# Patient Record
Sex: Female | Born: 1990 | Race: Black or African American | Hispanic: No | Marital: Single | Smoking: Never smoker
Health system: Southern US, Community
[De-identification: ages and names within clinical notes are randomized; demographics above are authoritative.]

---

## 2017-07-13 ENCOUNTER — Encounter: Payer: Self-pay | Admitting: Emergency Medicine

## 2017-07-13 DIAGNOSIS — N760 Acute vaginitis: Secondary | ICD-10-CM | POA: Diagnosis not present

## 2017-07-13 DIAGNOSIS — N939 Abnormal uterine and vaginal bleeding, unspecified: Secondary | ICD-10-CM | POA: Insufficient documentation

## 2017-07-13 DIAGNOSIS — B9689 Other specified bacterial agents as the cause of diseases classified elsewhere: Secondary | ICD-10-CM | POA: Diagnosis not present

## 2017-07-13 LAB — CBC
HCT: 32.2 % — ABNORMAL LOW (ref 35.0–47.0)
HEMOGLOBIN: 10.9 g/dL — AB (ref 12.0–16.0)
MCH: 27.8 pg (ref 26.0–34.0)
MCHC: 33.7 g/dL (ref 32.0–36.0)
MCV: 82.5 fL (ref 80.0–100.0)
Platelets: 336 10*3/uL (ref 150–440)
RBC: 3.91 MIL/uL (ref 3.80–5.20)
RDW: 15.1 % — ABNORMAL HIGH (ref 11.5–14.5)
WBC: 8.6 10*3/uL (ref 3.6–11.0)

## 2017-07-13 LAB — POCT PREGNANCY, URINE: Preg Test, Ur: NEGATIVE

## 2017-07-13 NOTE — ED Triage Notes (Signed)
Pt c/o vaginal bleeding x3 weeks. Per pt she had 2 weeks of light dark bleeding and in the last few days has started to have increase in bright red vaginal bleeding. Pts last menstrual cycle was 06/15/17 and pt sts she takes birth control pill dailey.

## 2017-07-14 ENCOUNTER — Emergency Department: Payer: 59

## 2017-07-14 ENCOUNTER — Emergency Department
Admission: EM | Admit: 2017-07-14 | Discharge: 2017-07-14 | Disposition: A | Discharge status: Home / Self Care | Payer: 59 | Attending: Emergency Medicine | Admitting: Emergency Medicine

## 2017-07-14 DIAGNOSIS — B9689 Other specified bacterial agents as the cause of diseases classified elsewhere: Secondary | ICD-10-CM

## 2017-07-14 DIAGNOSIS — N760 Acute vaginitis: Secondary | ICD-10-CM

## 2017-07-14 DIAGNOSIS — R58 Hemorrhage, not elsewhere classified: Secondary | ICD-10-CM

## 2017-07-14 DIAGNOSIS — N939 Abnormal uterine and vaginal bleeding, unspecified: Secondary | ICD-10-CM

## 2017-07-14 LAB — WET PREP, GENITAL
SPERM: NONE SEEN
TRICH WET PREP: NONE SEEN
Yeast Wet Prep HPF POC: NONE SEEN

## 2017-07-14 LAB — URINALYSIS, COMPLETE (UACMP) WITH MICROSCOPIC
BACTERIA UA: NONE SEEN
BILIRUBIN URINE: NEGATIVE
Glucose, UA: NEGATIVE mg/dL
KETONES UR: 5 mg/dL — AB
LEUKOCYTES UA: NEGATIVE
Nitrite: NEGATIVE
Protein, ur: 100 mg/dL — AB
SPECIFIC GRAVITY, URINE: 1.036 — AB (ref 1.005–1.030)
SQUAMOUS EPITHELIAL / LPF: NONE SEEN
pH: 5 (ref 5.0–8.0)

## 2017-07-14 LAB — CHLAMYDIA/NGC RT PCR (ARMC ONLY)
CHLAMYDIA TR: NOT DETECTED
N gonorrhoeae: NOT DETECTED

## 2017-07-14 MED ORDER — METRONIDAZOLE 500 MG PO TABS: 500.0000 mg | ORAL_TABLET | Freq: Two times a day (BID) | ORAL | 0 refills | Status: AC

## 2017-07-14 NOTE — Discharge Instructions (Signed)
1. Take antibiotic as prescribed (Flagyl 500 mg twice daily 7 days). 2. You may have an endometrial polyp on your ultrasound. Please make the appointment with the specialist for further evaluation. 3. Return to the ER for worsening symptoms, persistent vomiting, difficulty breathing or other concerns.

## 2017-07-14 NOTE — ED Notes (Signed)
Patient transported to Ultrasound at this time. 

## 2017-07-14 NOTE — ED Provider Notes (Addendum)
[facility] Emergency Department Provider Note   ____________________________________________   First MD Initiated Contact with Patient 07/14/17 [phone]     (approximate)  I have reviewed the triage vital signs and the nursing notes.   HISTORY  Chief Complaint Vaginal Bleeding    HPI Melissa Daugherty is a 26 y.o. female home with a chief complaint of vaginal bleeding. Patient reports a 3 week history of vaginal bleeding. Her last menstrual cycle was 06/15/17. She decided to take a holiday from her control and has been sent. Similar symptoms last year holiday from her birth control. At that time, she was found to have an ovarian cyst.Denies anticoagulant use. Denies associated fever, chills, chest pain, shortness of breath, nausea, vomiting, lightheadedness, dizziness. Denies recent travel or trauma. Nothing makes her symptoms better or worse.   Past medical history Left ovarian cyst  There are no active problems to display for this patient.   History reviewed. No pertinent surgical history.  Prior to Admission medications   Not on File    Allergies Patient has no known allergies.  History reviewed. No pertinent family history.  Social History Social History  Substance Use Topics  . Smoking status: Never Smoker  . Smokeless tobacco: Never Used  . Alcohol use No    Review of Systems  Constitutional: No fever/chills. Eyes: No visual changes. ENT: No sore throat. Cardiovascular: Denies chest pain. Respiratory: Denies shortness of breath. Gastrointestinal: No abdominal pain.  No nausea, no vomiting.  No diarrhea.  No constipation. Genitourinary: positive for vaginal bleeding. Negative for dysuria. Musculoskeletal: Negative for back pain. Skin: Negative for rash. Neurological: Negative for headaches, focal weakness or numbness.   ____________________________________________   PHYSICAL EXAM:  VITAL SIGNS: ED Triage Vitals  Enc  Vitals Group     BP 07/13/17 2349 139/90     Pulse Rate 07/13/17 2349 100     Resp 07/13/17 2349 17     Temp 07/13/17 2349 98 F (36.7 C)     Temp src --      SpO2 07/13/17 2349 100 %     Weight 07/13/17 2350 130 lb (59 kg)     Height 07/13/17 2350  (1.6 m)     Head Circumference --      Peak Flow --      Pain Score --      Pain Loc --      Pain Edu? --      Excl. in GC? --     Constitutional: Alert and oriented. Well appearing and in no acute distress. Eyes: Conjunctivae are normal. PERRL. EOMI. Head: Atraumatic. Nose: No congestion/rhinnorhea. Mouth/Throat: Mucous membranes are moist.  Oropharynx non-erythematous. Neck: No stridor.   Cardiovascular: Normal rate, regular rhythm. Grossly normal heart sounds.  Good peripheral circulation. Respiratory: Normal respiratory effort.  No retractions. Lungs CTAB. Gastrointestinal: Soft and nontender to light and deep palpation. No distention. No abdominal bruits. No CVA tenderness. Musculoskeletal: No lower extremity tenderness nor edema.  No joint effusions. Neurologic:  Normal speech and language. No gross focal neurologic deficits are appreciated. No gait instability. Skin:  Skin is warm, dry and intact. No rash noted. Psychiatric: Mood and affect are normal. Speech and behavior are normal.  ____________________________________________   LABS (all labs ordered are listed, but only abnormal results are displayed)  Labs Reviewed  WET PREP, GENITAL - Abnormal; Notable for the following:       Result Value   Clue Cells Wet Prep HPF POC  PRESENT (*)    WBC, Wet Prep HPF POC TOO NUMEROUS TO COUNT (*)    All other components within normal limits  CBC - Abnormal; Notable for the following:    Hemoglobin 10.9 (*)    HCT 32.2 (*)    RDW 15.1 (*)    All other components within normal limits  URINALYSIS, COMPLETE (UACMP) WITH MICROSCOPIC - Abnormal; Notable for the following:    Color, Urine YELLOW (*)    APPearance CLEAR (*)     Specific Gravity, Urine 1.036 (*)    Hgb urine dipstick MODERATE (*)    Ketones, ur 5 (*)    Protein, ur 100 (*)    All other components within normal limits  CHLAMYDIA/NGC RT PCR ([facility] ONLY)  POC URINE PREG, ED  POCT PREGNANCY, URINE   ____________________________________________  EKG  none ____________________________________________  RADIOLOGY  US Pelvis Transvanginal Non-ob (tv Only)  Result Date: 07/14/2017 CLINICAL DATA:  Vaginal bleeding for 3 weeks. EXAM: TRANSABDOMINAL AND TRANSVAGINAL ULTRASOUND OF PELVIS DOPPLER ULTRASOUND OF OVARIES TECHNIQUE: Both transabdominal and transvaginal ultrasound examinations of the pelvis were performed. Transabdominal technique was performed for global imaging of the pelvis including uterus, ovaries, adnexal regions, and pelvic cul-de-sac. It was necessary to proceed with endovaginal exam following the transabdominal exam to visualize the uterus, endometrium and ovaries. Color and duplex Doppler ultrasound was utilized to evaluate blood flow to the ovaries. COMPARISON:  None. FINDINGS: Uterus Measurements: 4.7 x 4.3 x 3.9 cm. No fibroids or other mass visualized. Endometrium Thickness: 7 mm. Small amount fluid in the endometrial canal. Question of endometrial polyp. Right ovary Measurements: 3.2 x 1.4 x 1.5 cm. Normal appearance with physiologic follicles and blood flow. No adnexal mass. Left ovary Measurements: 2.8 x 2.1 x 2.1 cm. Normal appearance with physiologic follicles and blood flow. No adnexal mass. Pulsed Doppler evaluation of both ovaries demonstrates normal low-resistance arterial and venous waveforms. Other findings No abnormal free fluid. IMPRESSION: 1. Normal endometrial thickness with small amount of fluid in the endometrial canal on question of endometrial polyp. Consider further evaluation with sonohysterogram for confirmation prior to hysteroscopy. Endometrial sampling should also be considered if patient is at high risk for  endometrial carcinoma. (Ref: Radiological Reasoning: Algorithmic Workup of Abnormal Vaginal Bleeding with Endovaginal Sonography and Sonohysterography. AJR 2008; 161:W96-04) 2. Normal sonographic appearance of the ovaries. Normal blood flow without torsion. Electronically Signed   By: Rubye Oaks M.D.   On: 07/14/2017 01:50   US Pelvis Complete  Result Date: 07/14/2017 CLINICAL DATA:  Vaginal bleeding for 3 weeks. EXAM: TRANSABDOMINAL AND TRANSVAGINAL ULTRASOUND OF PELVIS DOPPLER ULTRASOUND OF OVARIES TECHNIQUE: Both transabdominal and transvaginal ultrasound examinations of the pelvis were performed. Transabdominal technique was performed for global imaging of the pelvis including uterus, ovaries, adnexal regions, and pelvic cul-de-sac. It was necessary to proceed with endovaginal exam following the transabdominal exam to visualize the uterus, endometrium and ovaries. Color and duplex Doppler ultrasound was utilized to evaluate blood flow to the ovaries. COMPARISON:  None. FINDINGS: Uterus Measurements: 4.7 x 4.3 x 3.9 cm. No fibroids or other mass visualized. Endometrium Thickness: 7 mm. Small amount fluid in the endometrial canal. Question of endometrial polyp. Right ovary Measurements: 3.2 x 1.4 x 1.5 cm. Normal appearance with physiologic follicles and blood flow. No adnexal mass. Left ovary Measurements: 2.8 x 2.1 x 2.1 cm. Normal appearance with physiologic follicles and blood flow. No adnexal mass. Pulsed Doppler evaluation of both ovaries demonstrates normal low-resistance arterial and venous waveforms. Other  findings No abnormal free fluid. IMPRESSION: 1. Normal endometrial thickness with small amount of fluid in the endometrial canal on question of endometrial polyp. Consider further evaluation with sonohysterogram for confirmation prior to hysteroscopy. Endometrial sampling should also be considered if patient is at high risk for endometrial carcinoma. (Ref: Radiological Reasoning: Algorithmic  Workup of Abnormal Vaginal Bleeding with Endovaginal Sonography and Sonohysterography. AJR 2008; 161:W96-04) 2. Normal sonographic appearance of the ovaries. Normal blood flow without torsion. Electronically Signed   By: Rubye Oaks M.D.   On: 07/14/2017 01:50   US Pelvic Doppler (torsion R/o Or Mass Arterial Flow)  Result Date: 07/14/2017 CLINICAL DATA:  Vaginal bleeding for 3 weeks. EXAM: TRANSABDOMINAL AND TRANSVAGINAL ULTRASOUND OF PELVIS DOPPLER ULTRASOUND OF OVARIES TECHNIQUE: Both transabdominal and transvaginal ultrasound examinations of the pelvis were performed. Transabdominal technique was performed for global imaging of the pelvis including uterus, ovaries, adnexal regions, and pelvic cul-de-sac. It was necessary to proceed with endovaginal exam following the transabdominal exam to visualize the uterus, endometrium and ovaries. Color and duplex Doppler ultrasound was utilized to evaluate blood flow to the ovaries. COMPARISON:  None. FINDINGS: Uterus Measurements: 4.7 x 4.3 x 3.9 cm. No fibroids or other mass visualized. Endometrium Thickness: 7 mm. Small amount fluid in the endometrial canal. Question of endometrial polyp. Right ovary Measurements: 3.2 x 1.4 x 1.5 cm. Normal appearance with physiologic follicles and blood flow. No adnexal mass. Left ovary Measurements: 2.8 x 2.1 x 2.1 cm. Normal appearance with physiologic follicles and blood flow. No adnexal mass. Pulsed Doppler evaluation of both ovaries demonstrates normal low-resistance arterial and venous waveforms. Other findings No abnormal free fluid. IMPRESSION: 1. Normal endometrial thickness with small amount of fluid in the endometrial canal on question of endometrial polyp. Consider further evaluation with sonohysterogram for confirmation prior to hysteroscopy. Endometrial sampling should also be considered if patient is at high risk for endometrial carcinoma. (Ref: Radiological Reasoning: Algorithmic Workup of Abnormal Vaginal  Bleeding with Endovaginal Sonography and Sonohysterography. AJR 2008; 540:J81-19) 2. Normal sonographic appearance of the ovaries. Normal blood flow without torsion. Electronically Signed   By: Rubye Oaks M.D.   On: 07/14/2017 01:50    ____________________________________________   PROCEDURES  Procedure(s) performed:   Pelvic exam: External exam WNL without rashes, lesions or vesicles. Speculum exam reveals old brownish discharge; cervical os closed. Bimanual exam WNL.  Procedures  Critical Care performed: No  ____________________________________________   INITIAL IMPRESSION / ASSESSMENT AND PLAN / ED COURSE  Pertinent labs & imaging results that were available during my care of the patient were reviewed by me and considered in my medical decision making (see chart for details).  26 year old female presents with abnormal vaginal bleeding. Differential diagnosis includes, but is not limited to, ovarian cyst, ovarian torsion, acute appendicitis, diverticulitis, urinary tract infection/pyelonephritis, endometriosis, bowel obstruction, colitis, renal colic, gastroenteritis, hernia, pregnancy related pain including ectopic pregnancy, etc. Laboratory and urinalysis results notable for mild anemia. Will proceed with pelvic ultrasound.  Patient was updated of her laboratory, urinalysis, wet prep and ultrasound results. She had already restarted her birth control. She was discharged home in stable condition on Flagyl and encouraged to follow up with GYN. Strict return precautions were given. Patient verbalized understanding and agree with plan of care.      ____________________________________________   FINAL CLINICAL IMPRESSION(S) / ED DIAGNOSES  Final diagnoses:  Bleeding  Abnormal vaginal bleeding  BV (bacterial vaginosis)      NEW MEDICATIONS STARTED DURING THIS VISIT:  New Prescriptions  No medications on file     Note:  This document was prepared using Dragon  voice recognition software and may include unintentional dictation errors.    Irean Hong, MD 07/14/17 1610    Irean Hong, MD 07/14/17 [phone]

## 2017-07-29 ENCOUNTER — Encounter: Payer: Self-pay | Admitting: Obstetrics and Gynecology

## 2017-08-05 ENCOUNTER — Encounter: Payer: Self-pay | Admitting: Obstetrics and Gynecology

## 2017-10-05 ENCOUNTER — Encounter: Payer: Self-pay | Admitting: Obstetrics and Gynecology

## 2018-07-10 IMAGING — US US TRANSVAGINAL NON-OB
1 series · 13 of 25 positions shown · non-contrast
Comparison: None.

CLINICAL DATA: Vaginal bleeding for 3 weeks.

EXAM:
TRANSABDOMINAL AND TRANSVAGINAL ULTRASOUND OF PELVIS
DOPPLER ULTRASOUND OF OVARIES
TECHNIQUE: Both transabdominal and transvaginal ultrasound examinations of the
pelvis were performed. Transabdominal technique was performed for
global imaging of the pelvis including uterus, ovaries, adnexal
regions, and pelvic cul-de-sac.
It was necessary to proceed with endovaginal exam following the
transabdominal exam to visualize the uterus, endometrium and
ovaries. Color and duplex Doppler ultrasound was utilized to
evaluate blood flow to the ovaries.

[Series 1: us transvaginal non-ob · 0.17mm/px · 13 of 66 slices shown]
[im 1/66]
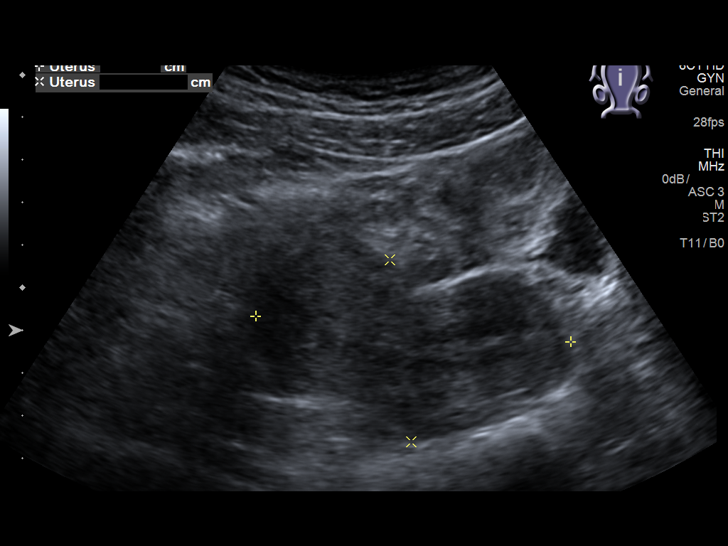
[im 6/66]
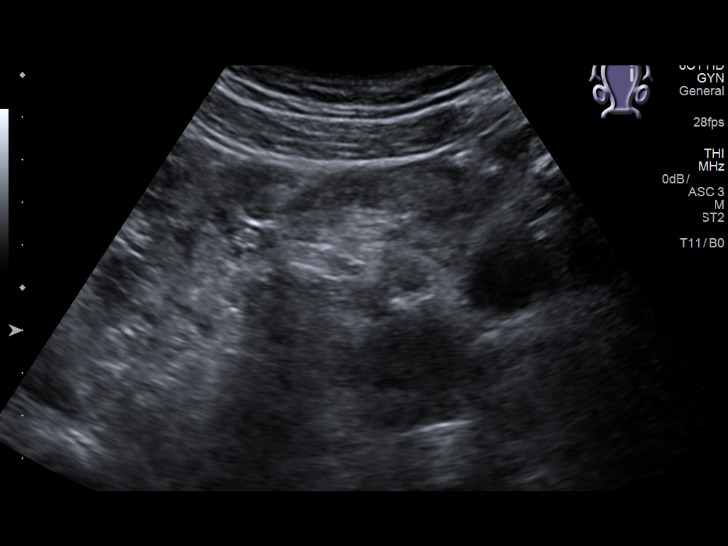
[im 11/66]
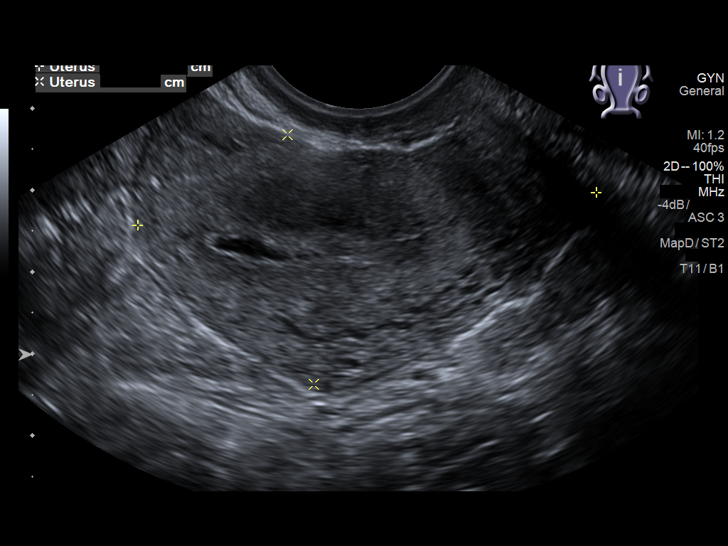
[im 17/66]
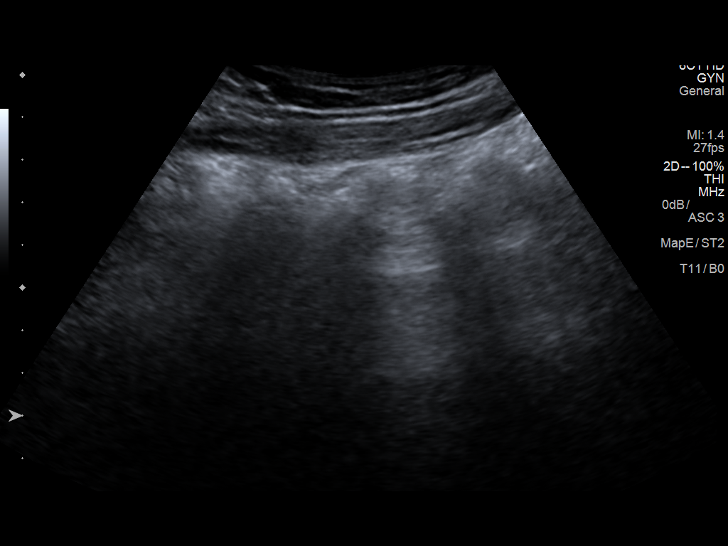
[im 22/66]
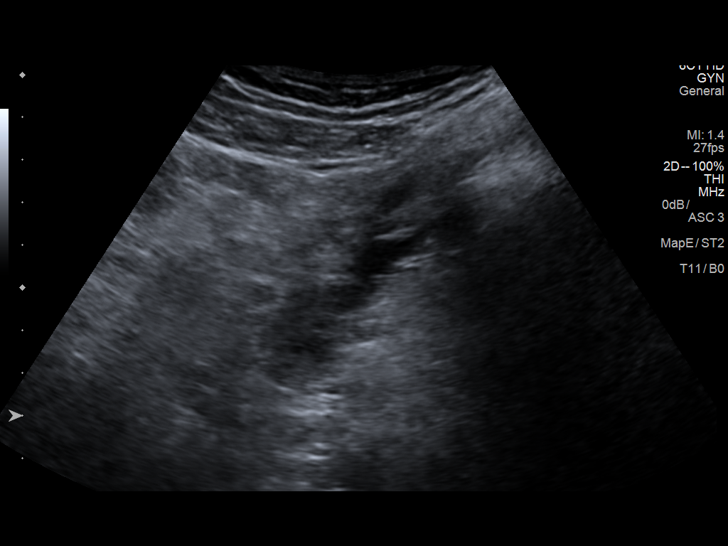
[im 28/66]
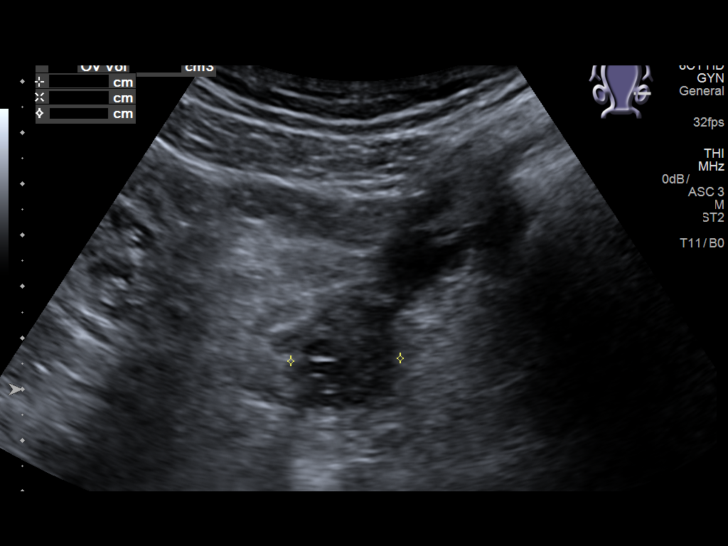
[im 33/66]
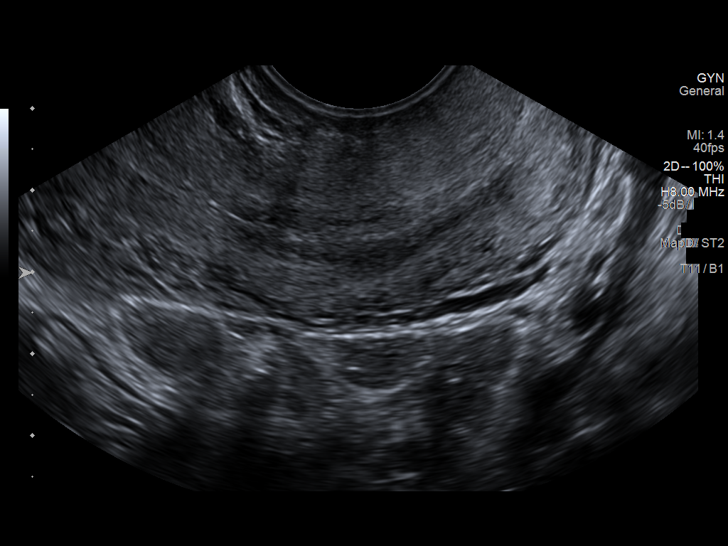
[im 38/66]
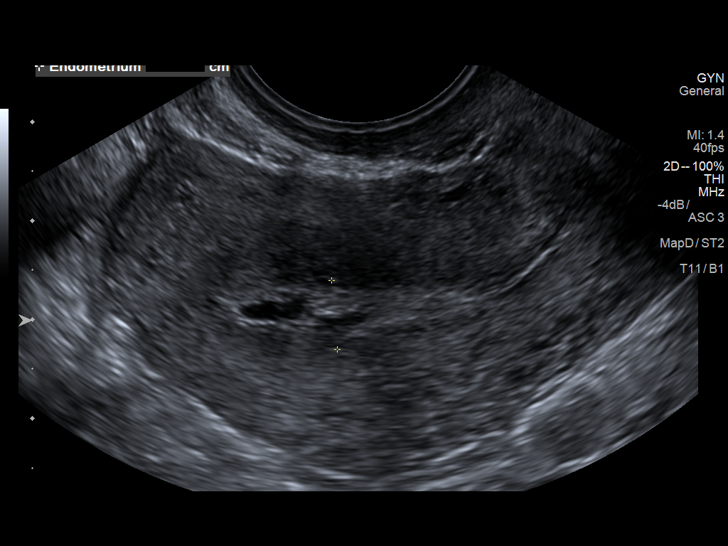
[im 44/66]
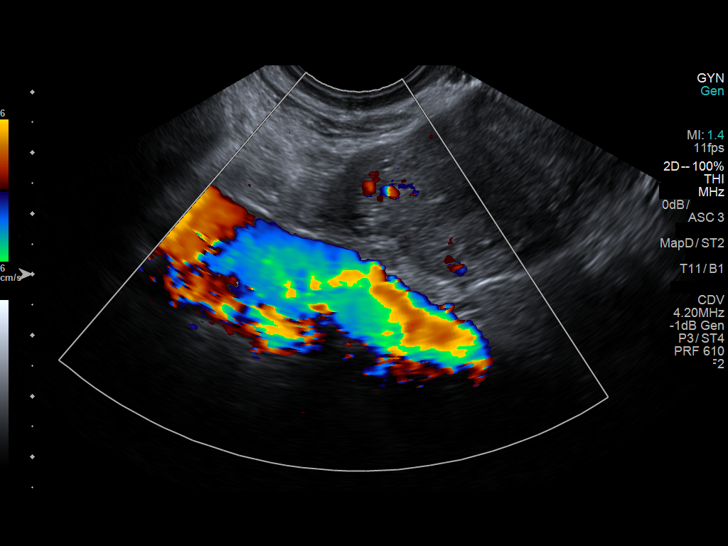
[im 49/66]
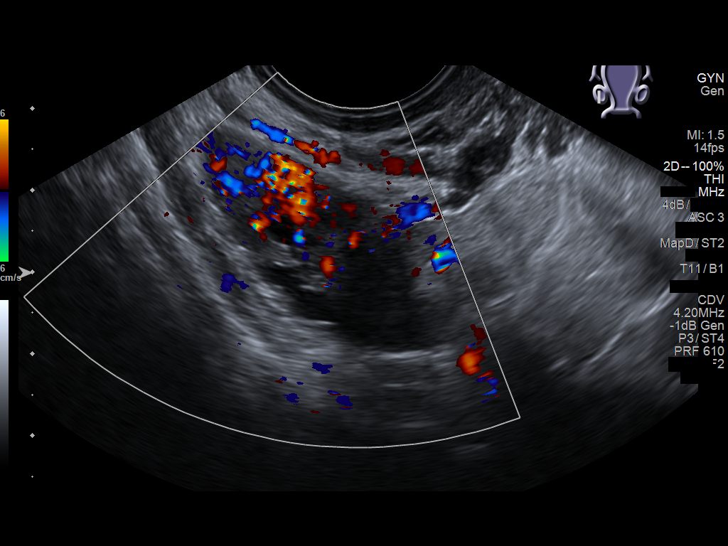
[im 55/66]
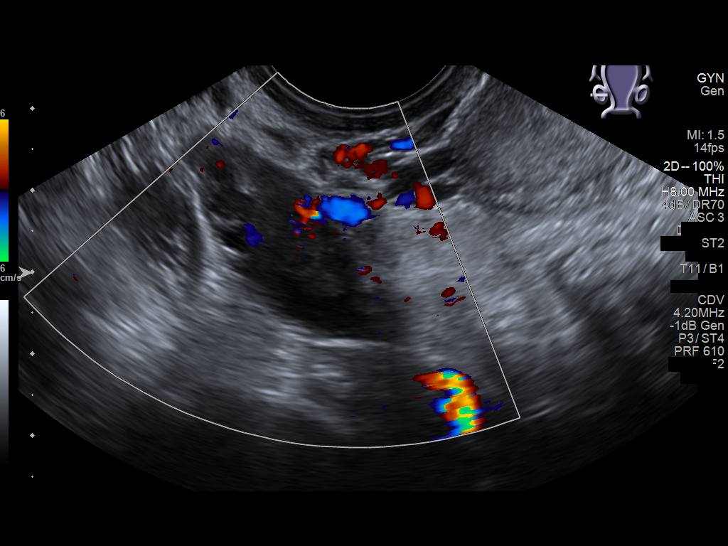
[im 60/66]
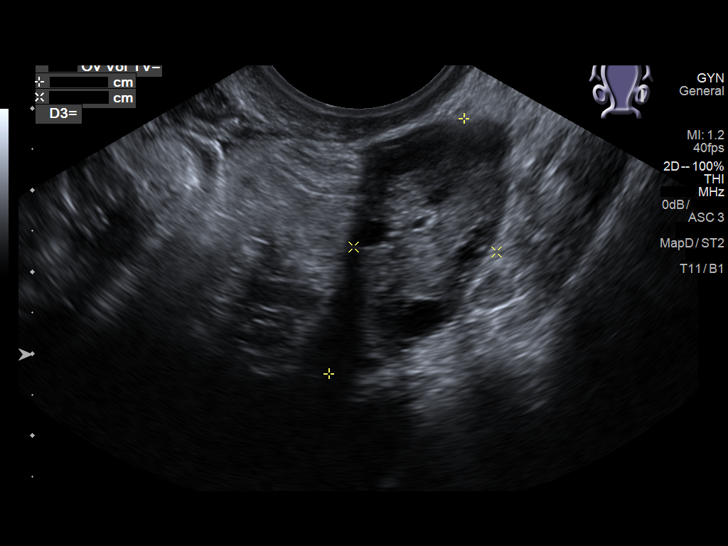
[im 66/66]
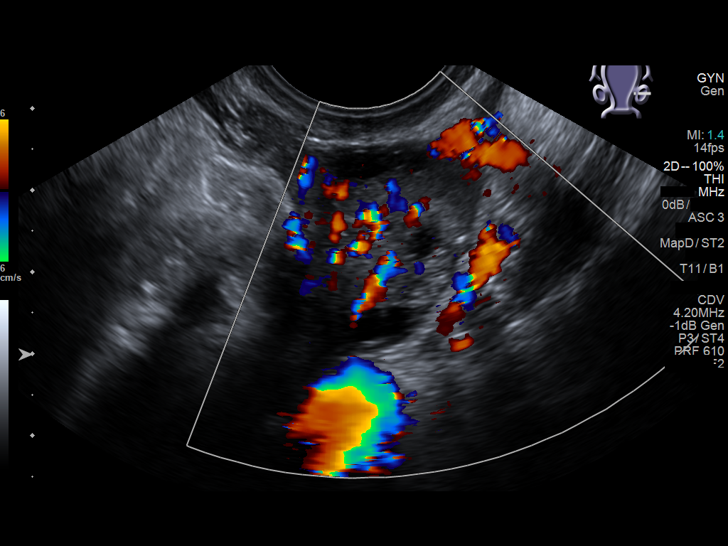

[13 of 25 positions shown; findings below may reference images not displayed]

FINDINGS: Uterus

Measurements: 4.7 x 4.3 x 3.9 cm. No fibroids or other mass
visualized.

Endometrium

Thickness: 7 mm. Small amount fluid in the endometrial canal.
Question of endometrial polyp.

Right ovary

Measurements: 3.2 x 1.4 x 1.5 cm. Normal appearance with physiologic
follicles and blood flow. No adnexal mass.

Left ovary

Measurements: 2.8 x 2.1 x 2.1 cm. Normal appearance with physiologic
follicles and blood flow. No adnexal mass.

Pulsed Doppler evaluation of both ovaries demonstrates normal
low-resistance arterial and venous waveforms.

Other findings

No abnormal free fluid.
IMPRESSION: 1. Normal endometrial thickness with small amount of fluid in the
endometrial canal on question of endometrial polyp. Consider further
evaluation with sonohysterogram for confirmation prior to
hysteroscopy. Endometrial sampling should also be considered if
patient is at high risk for endometrial carcinoma. (Ref:
Radiological Reasoning: Algorithmic Workup of Abnormal Vaginal
Bleeding with Endovaginal Sonography and Sonohysterography. AJR
0229; 191:S68-73)
2. Normal sonographic appearance of the ovaries. Normal blood flow
without torsion.

## 2018-09-28 ENCOUNTER — Encounter
